# Patient Record
Sex: Female | Born: 1965 | Race: White | Hispanic: No | Marital: Single | Smoking: Current every day smoker
Health system: Southern US, Community
[De-identification: ages and names within clinical notes are randomized; demographics above are authoritative.]

## PROBLEM LIST (undated history)

## (undated) DIAGNOSIS — I10 Essential (primary) hypertension: Secondary | ICD-10-CM

## (undated) DIAGNOSIS — G35 Multiple sclerosis: Secondary | ICD-10-CM

---

## 2001-12-02 ENCOUNTER — Encounter: Payer: Self-pay | Admitting: Family Medicine

## 2001-12-02 ENCOUNTER — Ambulatory Visit (HOSPITAL_COMMUNITY): Admission: RE | Admit: 2001-12-02 | Discharge: 2001-12-02 | Payer: Self-pay | Admitting: Family Medicine

## 2002-04-14 ENCOUNTER — Other Ambulatory Visit: Admission: RE | Admit: 2002-04-14 | Discharge: 2002-04-14 | Payer: Self-pay | Admitting: Family Medicine

## 2003-10-13 ENCOUNTER — Other Ambulatory Visit: Admission: RE | Admit: 2003-10-13 | Discharge: 2003-10-13 | Payer: Self-pay | Admitting: Family Medicine

## 2004-11-22 ENCOUNTER — Other Ambulatory Visit: Admission: RE | Admit: 2004-11-22 | Discharge: 2004-11-22 | Payer: Self-pay | Admitting: Family Medicine

## 2005-10-09 ENCOUNTER — Other Ambulatory Visit: Admission: RE | Admit: 2005-10-09 | Discharge: 2005-10-09 | Payer: Self-pay | Admitting: Family Medicine

## 2007-04-07 ENCOUNTER — Other Ambulatory Visit: Admission: RE | Admit: 2007-04-07 | Discharge: 2007-04-07 | Payer: Self-pay | Admitting: Family Medicine

## 2007-09-09 ENCOUNTER — Ambulatory Visit (HOSPITAL_COMMUNITY): Admission: RE | Admit: 2007-09-09 | Discharge: 2007-09-09 | Payer: Self-pay | Admitting: Obstetrics and Gynecology

## 2010-08-20 ENCOUNTER — Encounter: Payer: Self-pay | Admitting: Obstetrics and Gynecology

## 2011-10-23 ENCOUNTER — Other Ambulatory Visit (HOSPITAL_COMMUNITY)
Admission: RE | Admit: 2011-10-23 | Discharge: 2011-10-23 | Disposition: A | Discharge status: Home / Self Care | Payer: 59 | Source: Ambulatory Visit | Attending: Family Medicine | Admitting: Family Medicine

## 2011-10-23 ENCOUNTER — Other Ambulatory Visit: Payer: Self-pay | Admitting: Family Medicine

## 2011-10-23 DIAGNOSIS — Z01419 Encounter for gynecological examination (general) (routine) without abnormal findings: Secondary | ICD-10-CM | POA: Insufficient documentation

## 2016-11-28 DIAGNOSIS — B070 Plantar wart: Secondary | ICD-10-CM | POA: Diagnosis not present

## 2016-11-28 DIAGNOSIS — E7800 Pure hypercholesterolemia, unspecified: Secondary | ICD-10-CM | POA: Diagnosis not present

## 2016-11-28 DIAGNOSIS — Z23 Encounter for immunization: Secondary | ICD-10-CM | POA: Diagnosis not present

## 2016-12-18 DIAGNOSIS — G35 Multiple sclerosis: Secondary | ICD-10-CM | POA: Diagnosis not present

## 2017-03-31 DIAGNOSIS — J0110 Acute frontal sinusitis, unspecified: Secondary | ICD-10-CM | POA: Diagnosis not present

## 2017-03-31 DIAGNOSIS — Z720 Tobacco use: Secondary | ICD-10-CM | POA: Diagnosis not present

## 2017-04-08 DIAGNOSIS — B070 Plantar wart: Secondary | ICD-10-CM | POA: Diagnosis not present

## 2017-05-31 ENCOUNTER — Other Ambulatory Visit (HOSPITAL_COMMUNITY)
Admission: RE | Admit: 2017-05-31 | Discharge: 2017-05-31 | Disposition: A | Discharge status: Home / Self Care | Payer: 59 | Source: Ambulatory Visit | Attending: Family Medicine | Admitting: Family Medicine

## 2017-05-31 ENCOUNTER — Other Ambulatory Visit: Payer: Self-pay | Admitting: Family Medicine

## 2017-05-31 DIAGNOSIS — G35 Multiple sclerosis: Secondary | ICD-10-CM | POA: Diagnosis not present

## 2017-05-31 DIAGNOSIS — Z1211 Encounter for screening for malignant neoplasm of colon: Secondary | ICD-10-CM | POA: Diagnosis not present

## 2017-05-31 DIAGNOSIS — E7800 Pure hypercholesterolemia, unspecified: Secondary | ICD-10-CM | POA: Diagnosis not present

## 2017-05-31 DIAGNOSIS — B070 Plantar wart: Secondary | ICD-10-CM | POA: Diagnosis not present

## 2017-05-31 DIAGNOSIS — Z0000 Encounter for general adult medical examination without abnormal findings: Secondary | ICD-10-CM | POA: Diagnosis present

## 2017-06-03 LAB — CYTOLOGY - PAP
Diagnosis: NEGATIVE
HPV: NOT DETECTED

## 2017-08-19 ENCOUNTER — Other Ambulatory Visit: Payer: Self-pay | Admitting: Family Medicine

## 2017-08-19 ENCOUNTER — Ambulatory Visit
Admission: RE | Admit: 2017-08-19 | Discharge: 2017-08-19 | Disposition: A | Discharge status: Home / Self Care | Payer: 59 | Source: Ambulatory Visit | Attending: Family Medicine | Admitting: Family Medicine

## 2017-08-19 DIAGNOSIS — Z139 Encounter for screening, unspecified: Secondary | ICD-10-CM

## 2017-08-19 DIAGNOSIS — Z1231 Encounter for screening mammogram for malignant neoplasm of breast: Secondary | ICD-10-CM | POA: Diagnosis not present

## 2017-08-21 ENCOUNTER — Other Ambulatory Visit: Payer: Self-pay | Admitting: Family Medicine

## 2017-08-21 DIAGNOSIS — R928 Other abnormal and inconclusive findings on diagnostic imaging of breast: Secondary | ICD-10-CM

## 2017-08-28 ENCOUNTER — Ambulatory Visit
Admission: RE | Admit: 2017-08-28 | Discharge: 2017-08-28 | Disposition: A | Discharge status: Home / Self Care | Payer: 59 | Source: Ambulatory Visit | Attending: Family Medicine | Admitting: Family Medicine

## 2017-08-28 DIAGNOSIS — R928 Other abnormal and inconclusive findings on diagnostic imaging of breast: Secondary | ICD-10-CM

## 2017-08-28 DIAGNOSIS — N6310 Unspecified lump in the right breast, unspecified quadrant: Secondary | ICD-10-CM | POA: Diagnosis not present

## 2017-10-01 DIAGNOSIS — G35 Multiple sclerosis: Secondary | ICD-10-CM | POA: Diagnosis not present

## 2017-10-02 DIAGNOSIS — G35 Multiple sclerosis: Secondary | ICD-10-CM | POA: Diagnosis not present

## 2017-11-05 DIAGNOSIS — G35 Multiple sclerosis: Secondary | ICD-10-CM | POA: Diagnosis not present

## 2017-12-26 DIAGNOSIS — G35 Multiple sclerosis: Secondary | ICD-10-CM | POA: Diagnosis not present

## 2017-12-26 DIAGNOSIS — H0102A Squamous blepharitis right eye, upper and lower eyelids: Secondary | ICD-10-CM | POA: Diagnosis not present

## 2017-12-26 DIAGNOSIS — Z79899 Other long term (current) drug therapy: Secondary | ICD-10-CM | POA: Diagnosis not present

## 2018-03-07 DIAGNOSIS — G35 Multiple sclerosis: Secondary | ICD-10-CM | POA: Diagnosis not present

## 2018-03-10 DIAGNOSIS — G35 Multiple sclerosis: Secondary | ICD-10-CM | POA: Diagnosis not present

## 2018-03-24 DIAGNOSIS — R030 Elevated blood-pressure reading, without diagnosis of hypertension: Secondary | ICD-10-CM | POA: Diagnosis not present

## 2018-06-02 DIAGNOSIS — I10 Essential (primary) hypertension: Secondary | ICD-10-CM | POA: Diagnosis not present

## 2018-06-02 DIAGNOSIS — E7800 Pure hypercholesterolemia, unspecified: Secondary | ICD-10-CM | POA: Diagnosis not present

## 2018-06-02 DIAGNOSIS — Z0000 Encounter for general adult medical examination without abnormal findings: Secondary | ICD-10-CM | POA: Diagnosis not present

## 2018-06-03 DIAGNOSIS — G35 Multiple sclerosis: Secondary | ICD-10-CM | POA: Diagnosis not present

## 2018-06-03 DIAGNOSIS — Z79899 Other long term (current) drug therapy: Secondary | ICD-10-CM | POA: Diagnosis not present

## 2018-07-03 DIAGNOSIS — Z79899 Other long term (current) drug therapy: Secondary | ICD-10-CM | POA: Diagnosis not present

## 2018-07-03 DIAGNOSIS — G35 Multiple sclerosis: Secondary | ICD-10-CM | POA: Diagnosis not present

## 2018-07-03 DIAGNOSIS — Z9889 Other specified postprocedural states: Secondary | ICD-10-CM | POA: Diagnosis not present

## 2018-11-11 DIAGNOSIS — G35 Multiple sclerosis: Secondary | ICD-10-CM | POA: Diagnosis not present

## 2018-12-01 DIAGNOSIS — E7800 Pure hypercholesterolemia, unspecified: Secondary | ICD-10-CM | POA: Diagnosis not present

## 2018-12-01 DIAGNOSIS — I10 Essential (primary) hypertension: Secondary | ICD-10-CM | POA: Diagnosis not present

## 2018-12-08 DIAGNOSIS — E7800 Pure hypercholesterolemia, unspecified: Secondary | ICD-10-CM | POA: Diagnosis not present

## 2018-12-08 DIAGNOSIS — I10 Essential (primary) hypertension: Secondary | ICD-10-CM | POA: Diagnosis not present

## 2018-12-08 DIAGNOSIS — R7301 Impaired fasting glucose: Secondary | ICD-10-CM | POA: Diagnosis not present

## 2019-05-04 ENCOUNTER — Emergency Department (HOSPITAL_COMMUNITY)
Admission: EM | Admit: 2019-05-04 | Discharge: 2019-05-05 | Discharge status: Against Medical Advice | Payer: Self-pay | Attending: Emergency Medicine | Admitting: Emergency Medicine

## 2019-05-04 ENCOUNTER — Encounter (HOSPITAL_COMMUNITY): Payer: Self-pay

## 2019-05-04 ENCOUNTER — Emergency Department (HOSPITAL_COMMUNITY): Payer: Self-pay

## 2019-05-04 DIAGNOSIS — R0602 Shortness of breath: Secondary | ICD-10-CM | POA: Insufficient documentation

## 2019-05-04 DIAGNOSIS — R42 Dizziness and giddiness: Secondary | ICD-10-CM | POA: Insufficient documentation

## 2019-05-04 DIAGNOSIS — R0789 Other chest pain: Secondary | ICD-10-CM | POA: Insufficient documentation

## 2019-05-04 DIAGNOSIS — Z5321 Procedure and treatment not carried out due to patient leaving prior to being seen by health care provider: Secondary | ICD-10-CM | POA: Insufficient documentation

## 2019-05-04 HISTORY — DX: Essential (primary) hypertension: I10

## 2019-05-04 HISTORY — DX: Multiple sclerosis: G35

## 2019-05-04 LAB — BASIC METABOLIC PANEL
Anion gap: 13 (ref 5–15)
BUN: 5 mg/dL — ABNORMAL LOW (ref 6–20)
CO2: 24 mmol/L (ref 22–32)
Calcium: 9.6 mg/dL (ref 8.9–10.3)
Chloride: 100 mmol/L (ref 98–111)
Creatinine, Ser: 0.83 mg/dL (ref 0.44–1.00)
GFR calc Af Amer: >60 mL/min (ref >60)
GFR calc non Af Amer: >60 mL/min (ref >60)
Glucose, Bld: 107 mg/dL — ABNORMAL HIGH (ref 70–99)
Potassium: 3.5 mmol/L (ref 3.5–5.1)
Sodium: 137 mmol/L (ref 135–145)

## 2019-05-04 LAB — CBC
HCT: 48.1 % — ABNORMAL HIGH (ref 36.0–46.0)
Hemoglobin: 16.6 g/dL — ABNORMAL HIGH (ref 12.0–15.0)
MCH: 33.3 pg (ref 26.0–34.0)
MCHC: 34.5 g/dL (ref 30.0–36.0)
MCV: 96.4 fL (ref 80.0–100.0)
Platelets: 336 10*3/uL (ref 150–400)
RBC: 4.99 MIL/uL (ref 3.87–5.11)
RDW: 13.6 % (ref 11.5–15.5)
WBC: 13.2 10*3/uL — ABNORMAL HIGH (ref 4.0–10.5)
nRBC: 0 % (ref 0.0–0.2)

## 2019-05-04 LAB — I-STAT BETA HCG BLOOD, ED (MC, WL, AP ONLY): I-stat hCG, quantitative: <5 m[IU]/mL (ref <5)

## 2019-05-04 LAB — TROPONIN I (HIGH SENSITIVITY): Troponin I (High Sensitivity): 5 ng/L (ref <18)

## 2019-05-04 MED ORDER — SODIUM CHLORIDE 0.9% FLUSH: 3.0000 mL | Freq: Once | INTRAVENOUS | Status: DC

## 2019-05-04 NOTE — ED Triage Notes (Signed)
Pt states that she has been having HTN all day, a few hours ago began to have central CP with SOB denies dizziness.

## 2019-05-05 NOTE — ED Notes (Signed)
Pt did not want to wait and left. 

## 2020-03-07 ENCOUNTER — Other Ambulatory Visit: Payer: Self-pay | Admitting: Family Medicine

## 2020-03-07 DIAGNOSIS — Z1231 Encounter for screening mammogram for malignant neoplasm of breast: Secondary | ICD-10-CM

## 2020-03-17 ENCOUNTER — Ambulatory Visit
Admission: RE | Admit: 2020-03-17 | Discharge: 2020-03-17 | Disposition: A | Discharge status: Home / Self Care | Payer: No Typology Code available for payment source | Source: Ambulatory Visit | Attending: Family Medicine | Admitting: Family Medicine

## 2020-03-17 ENCOUNTER — Other Ambulatory Visit: Payer: Self-pay

## 2020-03-17 DIAGNOSIS — Z1231 Encounter for screening mammogram for malignant neoplasm of breast: Secondary | ICD-10-CM

## 2021-09-11 ENCOUNTER — Other Ambulatory Visit: Payer: Self-pay | Admitting: Family Medicine

## 2021-09-11 DIAGNOSIS — Z1231 Encounter for screening mammogram for malignant neoplasm of breast: Secondary | ICD-10-CM

## 2021-09-27 ENCOUNTER — Ambulatory Visit
Admission: RE | Admit: 2021-09-27 | Discharge: 2021-09-27 | Disposition: A | Discharge status: Home / Self Care | Payer: No Typology Code available for payment source | Source: Ambulatory Visit | Attending: Family Medicine | Admitting: Family Medicine

## 2021-09-27 DIAGNOSIS — Z1231 Encounter for screening mammogram for malignant neoplasm of breast: Secondary | ICD-10-CM

## 2021-10-09 ENCOUNTER — Other Ambulatory Visit: Payer: Self-pay | Admitting: Obstetrics and Gynecology

## 2021-11-20 ENCOUNTER — Ambulatory Visit: Payer: Self-pay | Admitting: Surgery

## 2022-10-15 ENCOUNTER — Other Ambulatory Visit: Payer: Self-pay | Admitting: Obstetrics and Gynecology

## 2022-10-15 ENCOUNTER — Other Ambulatory Visit (HOSPITAL_COMMUNITY)
Admission: RE | Admit: 2022-10-15 | Discharge: 2022-10-15 | Disposition: A | Discharge status: Home / Self Care | Payer: No Typology Code available for payment source | Source: Ambulatory Visit | Attending: Obstetrics and Gynecology | Admitting: Obstetrics and Gynecology

## 2022-10-15 DIAGNOSIS — Z01419 Encounter for gynecological examination (general) (routine) without abnormal findings: Secondary | ICD-10-CM | POA: Insufficient documentation

## 2022-10-17 LAB — CYTOLOGY - PAP
Comment: NEGATIVE
Diagnosis: NEGATIVE
High risk HPV: NEGATIVE

## 2023-06-05 IMAGING — MG MM DIGITAL SCREENING BILAT W/ TOMO AND CAD
6 of 10 series · 6 of 30 positions shown · non-contrast
Comparison: Previous exam(s).

CLINICAL DATA: Screening.

EXAM:
DIGITAL SCREENING BILATERAL MAMMOGRAM WITH TOMOSYNTHESIS AND CAD
TECHNIQUE: Bilateral screening digital craniocaudal and mediolateral oblique
mammograms were obtained. Bilateral screening digital breast
tomosynthesis was performed. The images were evaluated with
computer-aided detection.

[R MLO synth-2D (1 of 2)]
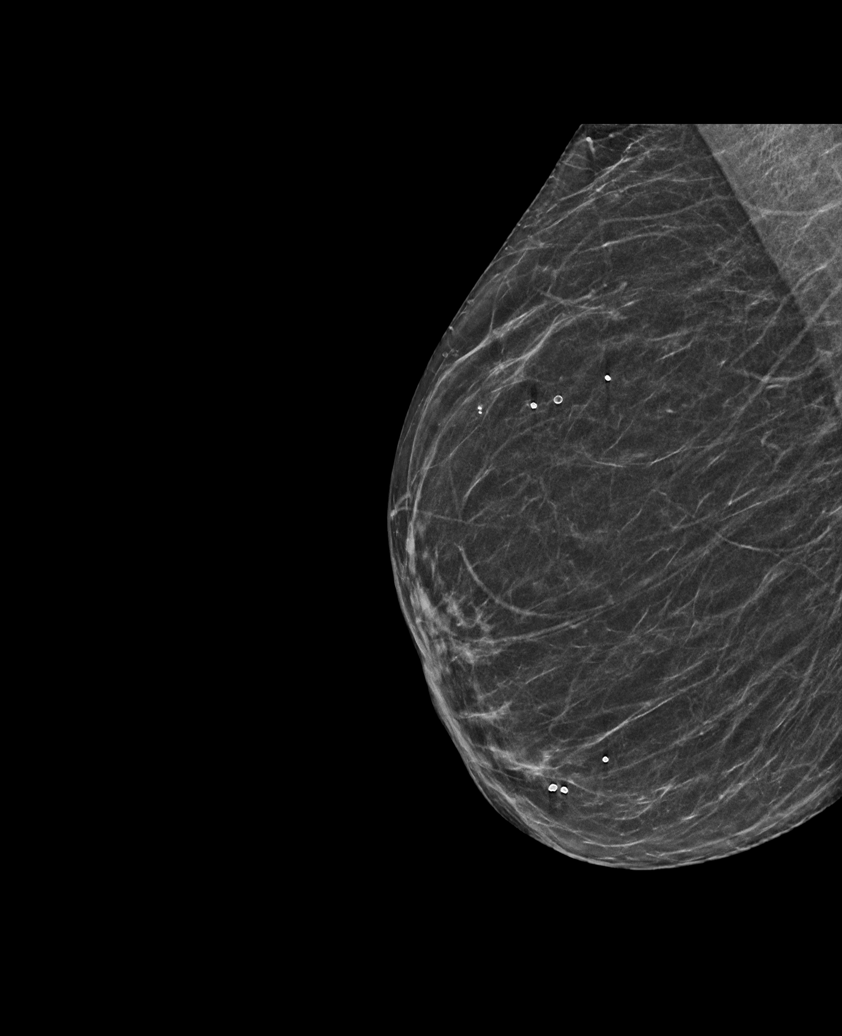

[R CC synth-2D]
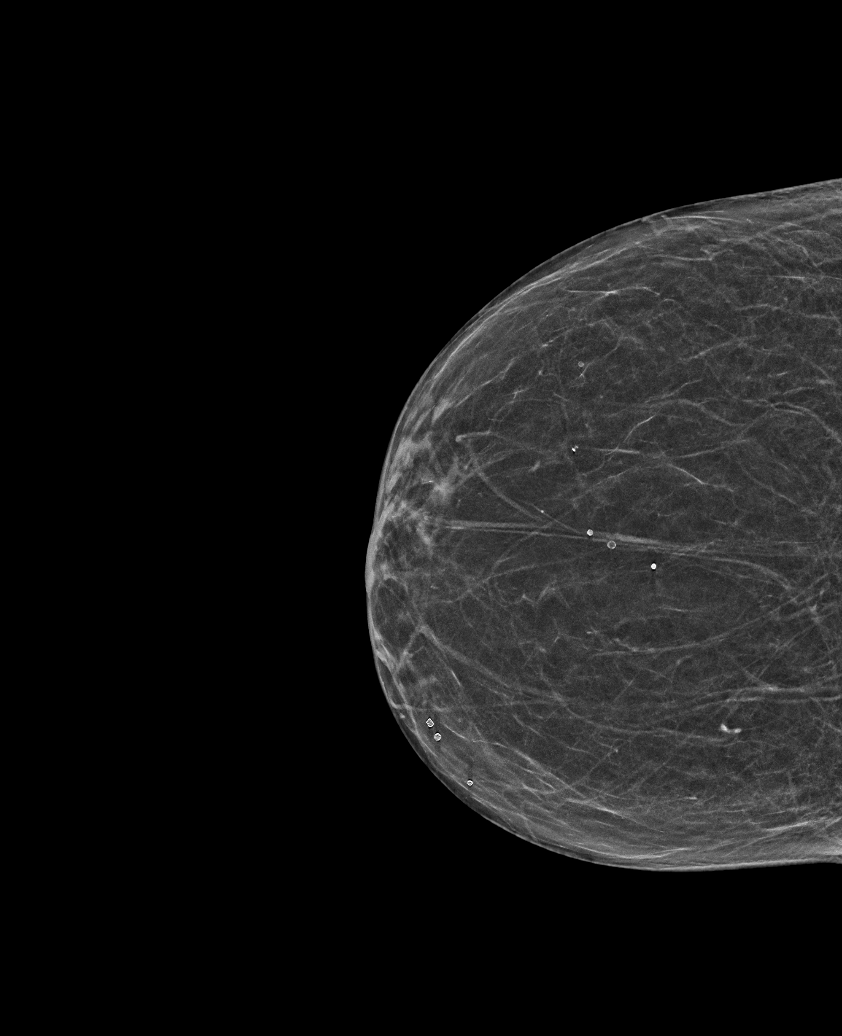

[L MLO synth-2D]
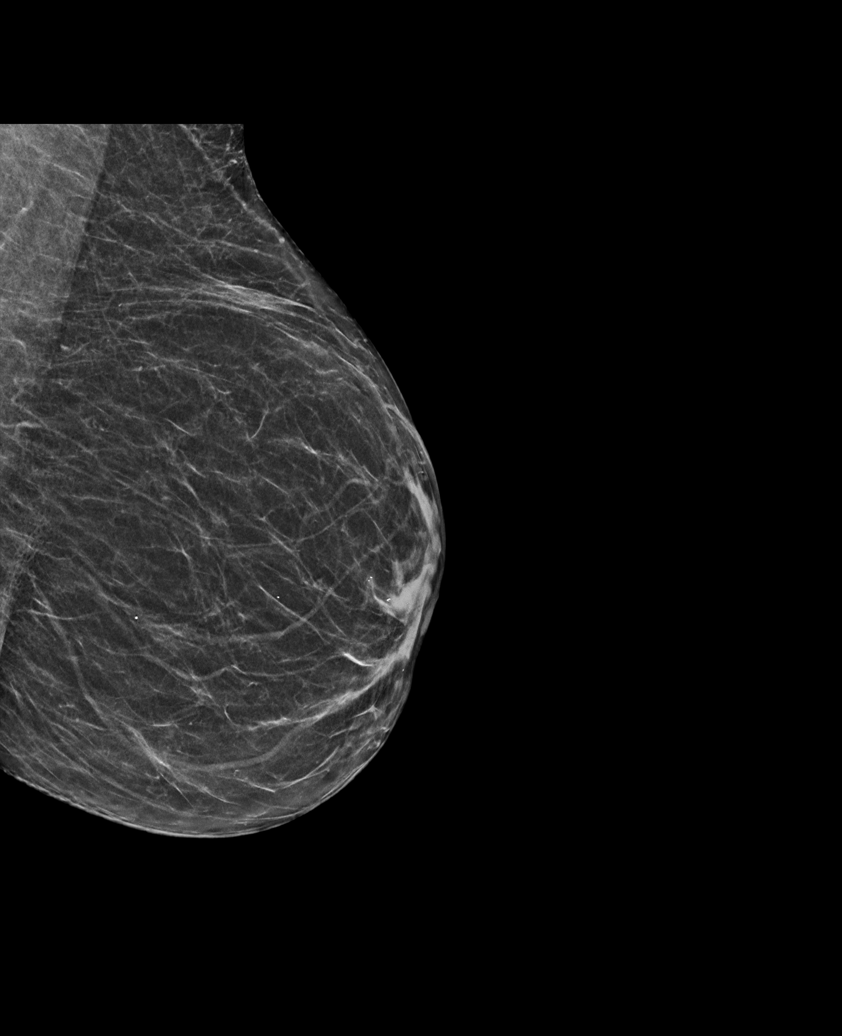

[R MLO synth-2D (2 of 2)]
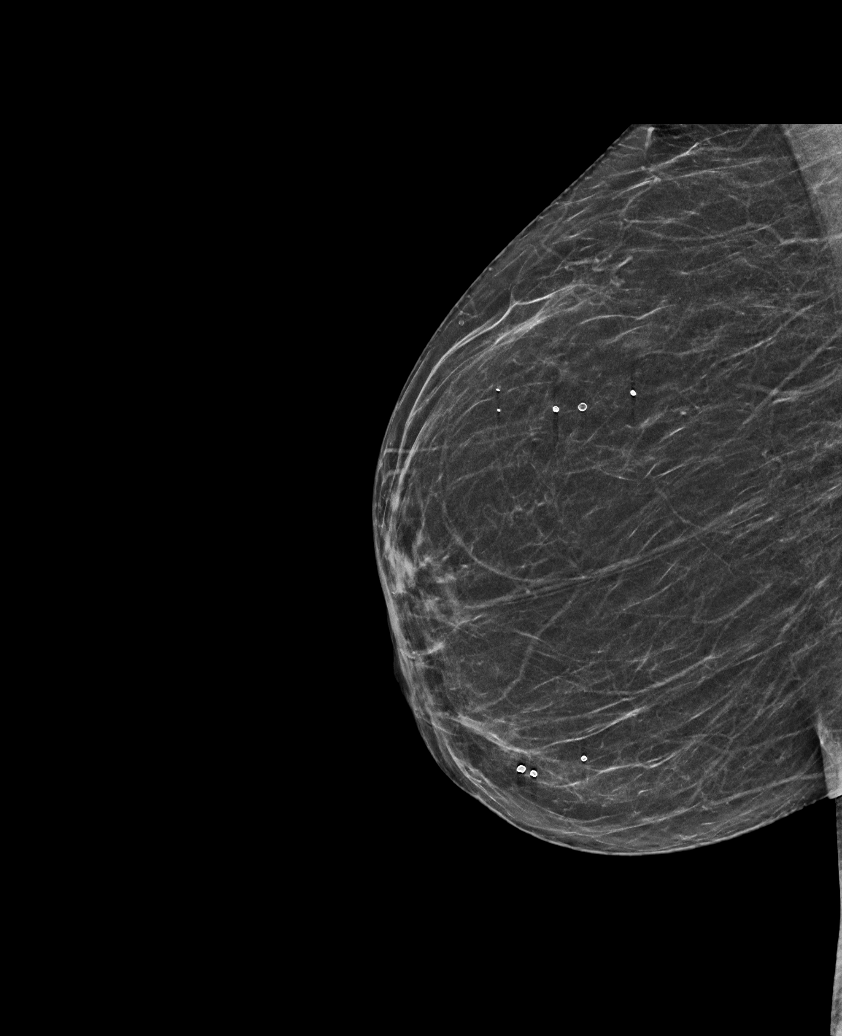

[L CC synth-2D]
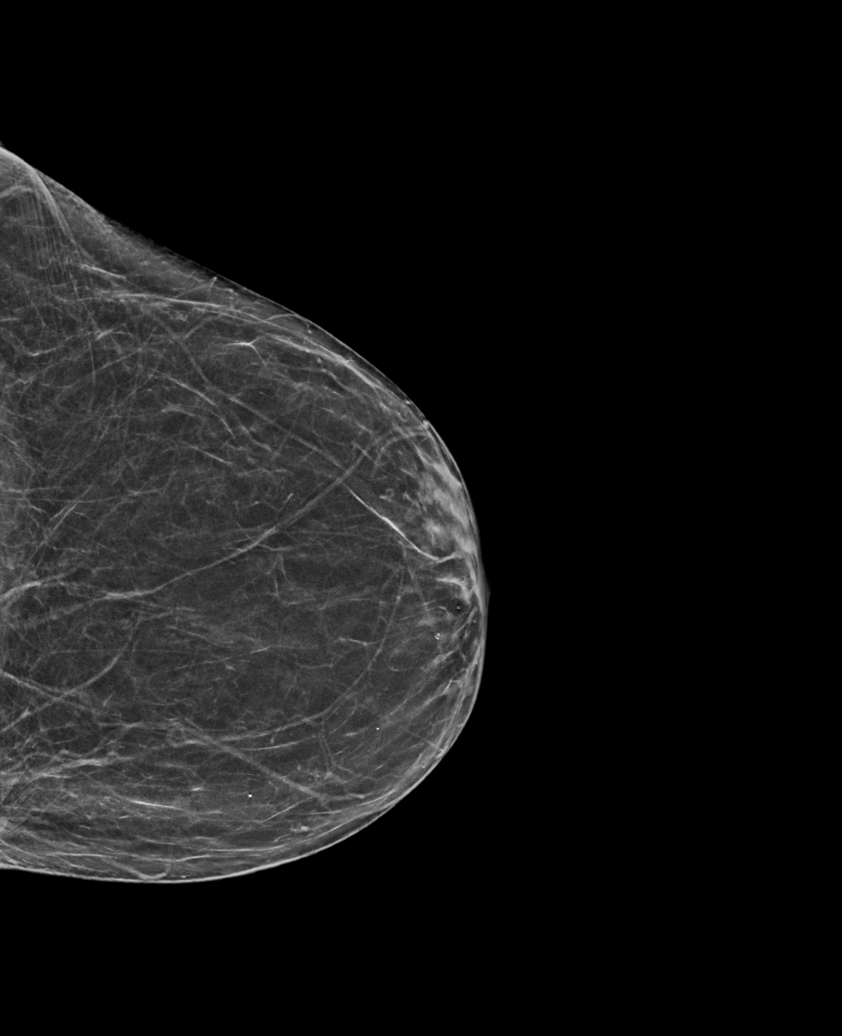

[L CC tomo · tomo slice 28/55.0]
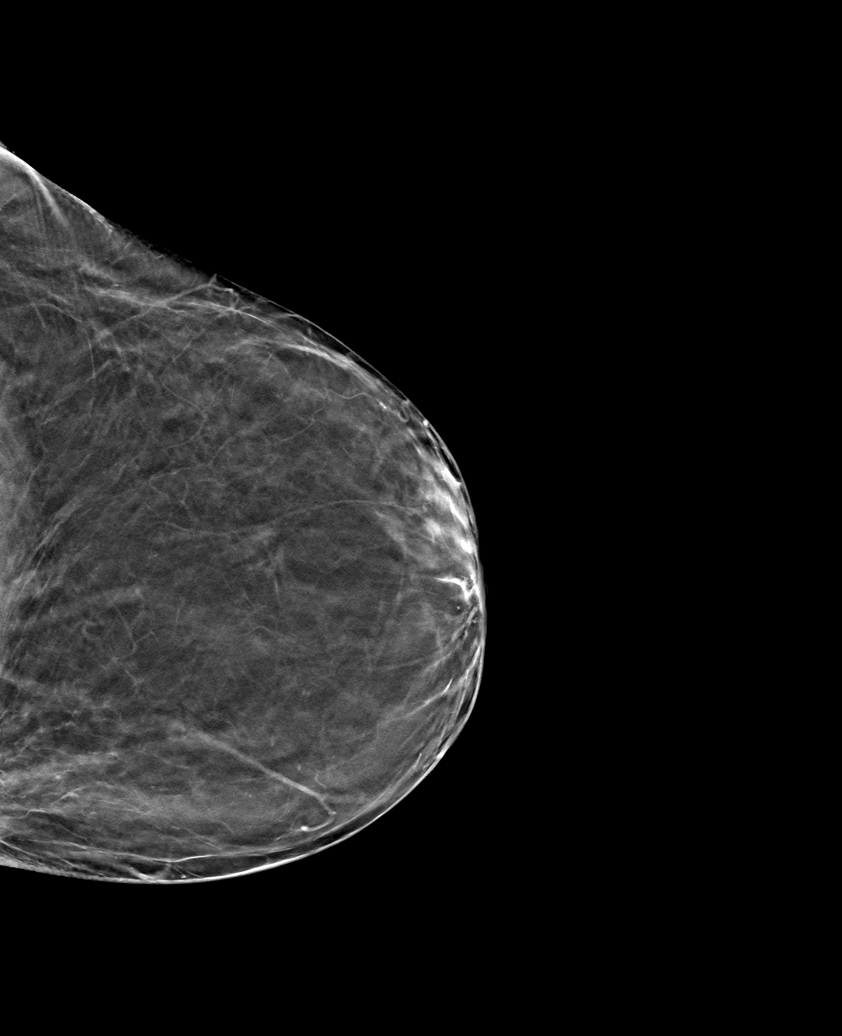

[6 of 30 positions shown; findings below may reference images not displayed]

ACR Breast Density Category b: There are scattered areas of
fibroglandular density.
FINDINGS: There are no findings suspicious for malignancy.
IMPRESSION: No mammographic evidence of malignancy. A result letter of this
screening mammogram will be mailed directly to the patient.

RECOMMENDATION:
Screening mammogram in one year. (Code:51-O-LD2)

BI-RADS CATEGORY  1: Negative.
